# Patient Record
Sex: Female | Born: 1990 | Race: Black or African American | Hispanic: No | Marital: Single | Smoking: Never smoker
Health system: Southern US, Community
[De-identification: ages and names within clinical notes are randomized; demographics above are authoritative.]

## PROBLEM LIST (undated history)

## (undated) DIAGNOSIS — L732 Hidradenitis suppurativa: Secondary | ICD-10-CM

---

## 2016-11-18 ENCOUNTER — Ambulatory Visit: Payer: Self-pay

## 2017-02-22 ENCOUNTER — Emergency Department (HOSPITAL_COMMUNITY)
Admission: EM | Admit: 2017-02-22 | Discharge: 2017-02-23 | Disposition: A | Discharge status: Home / Self Care | Payer: Self-pay | Attending: Emergency Medicine | Admitting: Emergency Medicine

## 2017-02-22 ENCOUNTER — Encounter (HOSPITAL_COMMUNITY): Payer: Self-pay | Admitting: *Deleted

## 2017-02-22 ENCOUNTER — Emergency Department (HOSPITAL_COMMUNITY): Payer: Self-pay

## 2017-02-22 DIAGNOSIS — Z79899 Other long term (current) drug therapy: Secondary | ICD-10-CM | POA: Insufficient documentation

## 2017-02-22 DIAGNOSIS — J189 Pneumonia, unspecified organism: Secondary | ICD-10-CM | POA: Insufficient documentation

## 2017-02-22 DIAGNOSIS — J4541 Moderate persistent asthma with (acute) exacerbation: Secondary | ICD-10-CM | POA: Insufficient documentation

## 2017-02-22 MED ORDER — ALBUTEROL SULFATE (2.5 MG/3ML) 0.083% IN NEBU
5.0000 mg | INHALATION_SOLUTION | Freq: Once | RESPIRATORY_TRACT | Status: AC
  Administered 2017-02-23: 5 mg via RESPIRATORY_TRACT
  Filled 2017-02-22: qty 6

## 2017-02-22 MED ORDER — PREDNISONE 20 MG PO TABS
60.0000 mg | ORAL_TABLET | Freq: Once | ORAL | Status: AC
  Administered 2017-02-23: 60 mg via ORAL
  Filled 2017-02-22: qty 3

## 2017-02-22 NOTE — ED Triage Notes (Addendum)
Pt was recently diagnosed with pneumonia on 3/9 and placed on antibiotics. Pt went to urgent care today and was told they did not see any signs of pneumonia. Pt had elevated WBC and was told to go to ED for further evaluation.  Pt states her coughing and wheezing have gotten worse.

## 2017-02-22 NOTE — ED Notes (Signed)
Pt in xray

## 2017-02-22 NOTE — ED Notes (Signed)
Pt when to Urgent care a few hours ago. Had chest xray and blood work  Pt verbalized soar throat --  c/o of back, chest, pain SOB  Waiting provider ok to submit strep sample  Waiting provider ok to administer albuterol tx

## 2017-02-22 NOTE — ED Provider Notes (Signed)
[facility]-EMERGENCY DEPT Provider Note   CSN: [id number] Arrival date & time: 02/22/17  2114  By signing my name below, I, Doreatha Martin, attest that this documentation has been prepared under the direction and in the presence of Devoria Albe, MD. Electronically Signed: Doreatha Martin, ED Scribe. 02/22/17. 11:20 PM.  Time seen: 11:06 PM   History   Chief Complaint Chief Complaint  Patient presents with  . Cough  . Shortness of Breath    HPI Lauren Gregory is a 26 y.o. female on no regular daily medications who presents to the Emergency Department complaining of persistent productive cough with yellow/green phlegm for 16 days. Pt was dx with PNA on 02/09/17 at [facility] after 3 days of productive cough with green/yellow sputum, rhinorrhea with clear/yellow drainage, sore throat, diarrhea, chills, subjective fever, generalized body aches, b/l upper back pain, SOB, wheezing, chest tightness, and was placed on amoxicillin, which improved her symptoms temporarily until 5 days ago after returning to work on March 17. She was also seen on 02/18/17 at the same UC and was given Levaquin, of which she has 5 doses left and states has not improved her symptoms. After being seen at the same UC today, she had CXR with no findings of PNA, and was referred to the ED for elevated WBC count. Per pt, she continues to experience all of the symptoms she had initially. Pt states her chills are more prevalent at night and her sore throat is worsened with swallowing. She also notes occasional specks of blood in her phlegm, but denies gross blood or hemoptysis. Per pt, she has used an inhaler at home with temporary relief of wheezing. This is the first time in her adulthood using an inhaler. She states she had bronchitis as a child. She denies FHx of asthma. Pt is a current smoker, 5-6 cigarettes per day. She is an occasional drinker. 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 She denies sneezing,  trouble swallowing, additional complaints.   PCP- No PCP Per Patient    The history is provided by the patient. No language interpreter was used.    History reviewed. No pertinent past medical history.  There are no active problems to display for this patient.   History reviewed. No pertinent surgical history.  OB History    No data available       Home Medications    Prior to Admission medications   Medication Sig Start Date End Date Taking? Authorizing Provider  levofloxacin (LEVAQUIN) 500 MG tablet Take 500 mg by mouth daily.   Yes Historical Provider, MD  albuterol (PROVENTIL HFA;VENTOLIN HFA) 108 (90 Base) MCG/ACT inhaler Inhale 2 puffs into the lungs every 4 (four) hours as needed for wheezing or shortness of breath. 02/23/17   Devoria Albe, MD  amoxicillin (AMOXIL) 500 MG capsule Take 1 capsule (500 mg total) by mouth 3 (three) times daily. 02/23/17   Devoria Albe, MD  predniSONE (DELTASONE) 20 MG tablet Take 3 po QD x 3d , then 2 po QD x 3d then 1 po QD x 3d 02/23/17   Devoria Albe, MD    Family History No family history on file.  Social History Social History  Substance Use Topics  . Smoking status: Never Smoker  . Smokeless tobacco: Never Used  . Alcohol use Yes  employed   Allergies   Patient has no known allergies.   Review of Systems Review of Systems  Constitutional: Positive for chills  and fever (subjective).  HENT: Positive for rhinorrhea and sore throat. Negative for sneezing and trouble swallowing.   Respiratory: Positive for cough, chest tightness, shortness of breath and wheezing.   Gastrointestinal: Positive for diarrhea.  Musculoskeletal: Positive for back pain and myalgias (generalized).  All other systems reviewed and are negative.  Physical Exam Updated Vital Signs BP (!) 144/80 (BP Location: Right Arm)   Pulse (!) 109   Temp 99.6 F (37.6 C) (Oral)   Resp (!) 21   Wt 215 lb (97.5 kg)   SpO2 96%   Vital signs normal except for tachycardia  and low grade temp   Physical Exam  Constitutional: She is oriented to person, place, and time. She appears well-developed and well-nourished.  Non-toxic appearance. She does not appear ill. No distress.  HENT:  Head: Normocephalic and atraumatic.  Right Ear: External ear normal.  Left Ear: External ear normal.  Nose: Nose normal. No mucosal edema or rhinorrhea.  Mouth/Throat: Oropharynx is clear and moist and mucous membranes are normal. No dental abscesses or uvula swelling.  Eyes: Conjunctivae and EOM are normal. Pupils are equal, round, and reactive to light.  Neck: Normal range of motion and full passive range of motion without pain. Neck supple.  Cardiovascular: Normal rate, regular rhythm and normal heart sounds.  Exam reveals no gallop and no friction rub.   No murmur heard. Pulmonary/Chest: Effort normal. Tachypnea noted. No respiratory distress. She has decreased breath sounds. She has wheezes. She has no rhonchi. She has no rales. She exhibits no tenderness and no crepitus.  Rare wheeze.   Abdominal: Soft. Normal appearance and bowel sounds are normal. She exhibits no distension. There is no tenderness. There is no rebound and no guarding.  Musculoskeletal: Normal range of motion. She exhibits no edema or tenderness.  Moves all extremities well.   Neurological: She is alert and oriented to person, place, and time. She has normal strength. No cranial nerve deficit.  Skin: Skin is warm, dry and intact. No rash noted. No erythema. No pallor.  Psychiatric: She has a normal mood and affect. Her speech is normal and behavior is normal. Her mood appears not anxious.  Nursing note and vitals reviewed.  ED Treatments / Results   Labs (all labs ordered are listed, but only abnormal results are displayed) Results for orders placed or performed during the hospital encounter of 02/22/17  Rapid strep screen  Result Value Ref Range   Streptococcus, Group A Screen (Direct) NEGATIVE  NEGATIVE   Laboratory interpretation all normal     EKG  EKG Interpretation None       Radiology Dg Chest 2 View  Result Date: 02/22/2017 CLINICAL DATA:  Persistent productive cough EXAM: CHEST  2 VIEW COMPARISON:  None. FINDINGS: Streaky perihilar opacities. No focal consolidation or effusion. Normal heart size. No pneumothorax. IMPRESSION: Streaky perihilar opacities could relate to central airways infection/inflammation. There is no focal pneumonia. Electronically Signed   By: Jasmine PangKim  Fujinaga M.D.   On: 02/22/2017 23:52    Procedures Procedures (including critical care time)  Medications Ordered in ED Medications  aerochamber Z-Stat Plus/medium 1 each (not administered)  albuterol (PROVENTIL) (2.5 MG/3ML) 0.083% nebulizer solution 5 mg (5 mg Nebulization Given 02/23/17 0007)  predniSONE (DELTASONE) tablet 60 mg (60 mg Oral Given 02/23/17 0007)  albuterol (PROVENTIL) (2.5 MG/3ML) 0.083% nebulizer solution 5 mg (5 mg Nebulization Given 02/23/17 0153)  ipratropium (ATROVENT) nebulizer solution 0.5 mg (0.5 mg Nebulization Given 02/23/17 0153)  ibuprofen (ADVIL,MOTRIN) tablet  600 mg (600 mg Oral Given 02/23/17 0152)  ipratropium (ATROVENT) nebulizer solution 0.5 mg (0.5 mg Nebulization Given 02/23/17 0209)  albuterol (PROVENTIL) (2.5 MG/3ML) 0.083% nebulizer solution 5 mg (2.5 mg Nebulization Given 02/23/17 0210)  albuterol (PROVENTIL) (2.5 MG/3ML) 0.083% nebulizer solution 5 mg (2.5 mg Nebulization Given 02/23/17 0224)   Initial Impression / Assessment and Plan / ED Course  I have reviewed the triage vital signs and the nursing notes.  Pertinent labs & imaging results that were available during my care of the patient were reviewed by me and considered in my medical decision making (see chart for details).     DIAGNOSTIC STUDIES: Oxygen Saturation is 96% on RA, adequate by my interpretation.    COORDINATION OF CARE: 11:17 PM Discussed treatment plan with pt at bedside which includes  rapid strep, CXR, prednisone, breathing treatment and pt agreed to plan.  12:45 AM Recheck Improved air movement, rare wheeze. Will order additional breathing tx.    2:40 AM On recheck pt reports improvement after second breathing tx. Lungs improved with no appreciable wheeze. Pt requests ibuprofen for back pain. Pt reports she does not have a spacer at home, so she will be provided with one along with inhaler refill prior to discharge. Pt will also be discharged with steroids and amoxicillin. Recommended Mucinex DM for cough, alternating Tylenol/Motrin for chest wall pain. Strict return precautions discussed.   Final Clinical Impressions(s) / ED Diagnoses   Final diagnoses:  Moderate persistent asthma with exacerbation  Community acquired pneumonia, unspecified laterality    New Prescriptions New Prescriptions   ALBUTEROL (PROVENTIL HFA;VENTOLIN HFA) 108 (90 BASE) MCG/ACT INHALER    Inhale 2 puffs into the lungs every 4 (four) hours as needed for wheezing or shortness of breath.   AMOXICILLIN (AMOXIL) 500 MG CAPSULE    Take 1 capsule (500 mg total) by mouth 3 (three) times daily.   PREDNISONE (DELTASONE) 20 MG TABLET    Take 3 po QD x 3d , then 2 po QD x 3d then 1 po QD x 3d    Plan discharge  Devoria Albe, MD, FACEP  I personally performed the services described in this documentation, which was scribed in my presence. The recorded information has been reviewed and considered.  Devoria Albe, MD, Concha Pyo, MD 02/23/17 [phone]

## 2017-02-23 LAB — RAPID STREP SCREEN (MED CTR MEBANE ONLY): Streptococcus, Group A Screen (Direct): NEGATIVE

## 2017-02-23 MED ORDER — IPRATROPIUM BROMIDE 0.02 % IN SOLN
0.5000 mg | Freq: Once | RESPIRATORY_TRACT | Status: AC
  Administered 2017-02-23: 0.5 mg via RESPIRATORY_TRACT
  Filled 2017-02-23: qty 2.5

## 2017-02-23 MED ORDER — ALBUTEROL SULFATE (2.5 MG/3ML) 0.083% IN NEBU
5.0000 mg | INHALATION_SOLUTION | Freq: Once | RESPIRATORY_TRACT | Status: AC
  Administered 2017-02-23: 2.5 mg via RESPIRATORY_TRACT
  Filled 2017-02-23: qty 6

## 2017-02-23 MED ORDER — AEROCHAMBER Z-STAT PLUS/MEDIUM MISC
1.0000 | Freq: Once | Status: AC
  Administered 2017-02-23: 1
  Filled 2017-02-23: qty 1

## 2017-02-23 MED ORDER — ALBUTEROL SULFATE (2.5 MG/3ML) 0.083% IN NEBU
5.0000 mg | INHALATION_SOLUTION | Freq: Once | RESPIRATORY_TRACT | Status: AC
  Administered 2017-02-23: 2.5 mg via RESPIRATORY_TRACT

## 2017-02-23 MED ORDER — IBUPROFEN 200 MG PO TABS
600.0000 mg | ORAL_TABLET | Freq: Once | ORAL | Status: AC
  Administered 2017-02-23: 600 mg via ORAL
  Filled 2017-02-23: qty 3

## 2017-02-23 MED ORDER — ALBUTEROL SULFATE HFA 108 (90 BASE) MCG/ACT IN AERS: 2.0000 | INHALATION_SPRAY | RESPIRATORY_TRACT | 0 refills | Status: DC | PRN

## 2017-02-23 MED ORDER — AMOXICILLIN 500 MG PO CAPS: 500.0000 mg | ORAL_CAPSULE | Freq: Three times a day (TID) | ORAL | 0 refills | Status: DC

## 2017-02-23 MED ORDER — PREDNISONE 20 MG PO TABS: ORAL_TABLET | ORAL | 0 refills | Status: DC

## 2017-02-23 MED ORDER — ALBUTEROL SULFATE (2.5 MG/3ML) 0.083% IN NEBU
5.0000 mg | INHALATION_SOLUTION | Freq: Once | RESPIRATORY_TRACT | Status: AC
  Administered 2017-02-23: 5 mg via RESPIRATORY_TRACT
  Filled 2017-02-23: qty 6

## 2017-02-23 NOTE — ED Notes (Signed)
Pt ambulated around the department and back to the pt room 99%-98% a little short of breath

## 2017-02-23 NOTE — Discharge Instructions (Signed)
Use your inhaler with the spacer to make it more effective. Add the Amoxil to your Levaquin antibiotic. Take the prednisone until gone. Take mucinex DM OTC for cough (get the generic).  Recheck if you get a high fever, you have uncontrolled vomiting or you seem to be getting worse instead of better.

## 2017-02-23 NOTE — ED Notes (Signed)
Provider notified pt spilled meds and meds re ordered and given per order request

## 2017-02-25 LAB — CULTURE, GROUP A STREP (THRC)

## 2018-10-21 IMAGING — CR DG CHEST 2V
2 series · 2 of 2 positions shown · non-contrast
Comparison: None.

CLINICAL DATA: Persistent productive cough

EXAM:
CHEST  2 VIEW

[w chest pa]
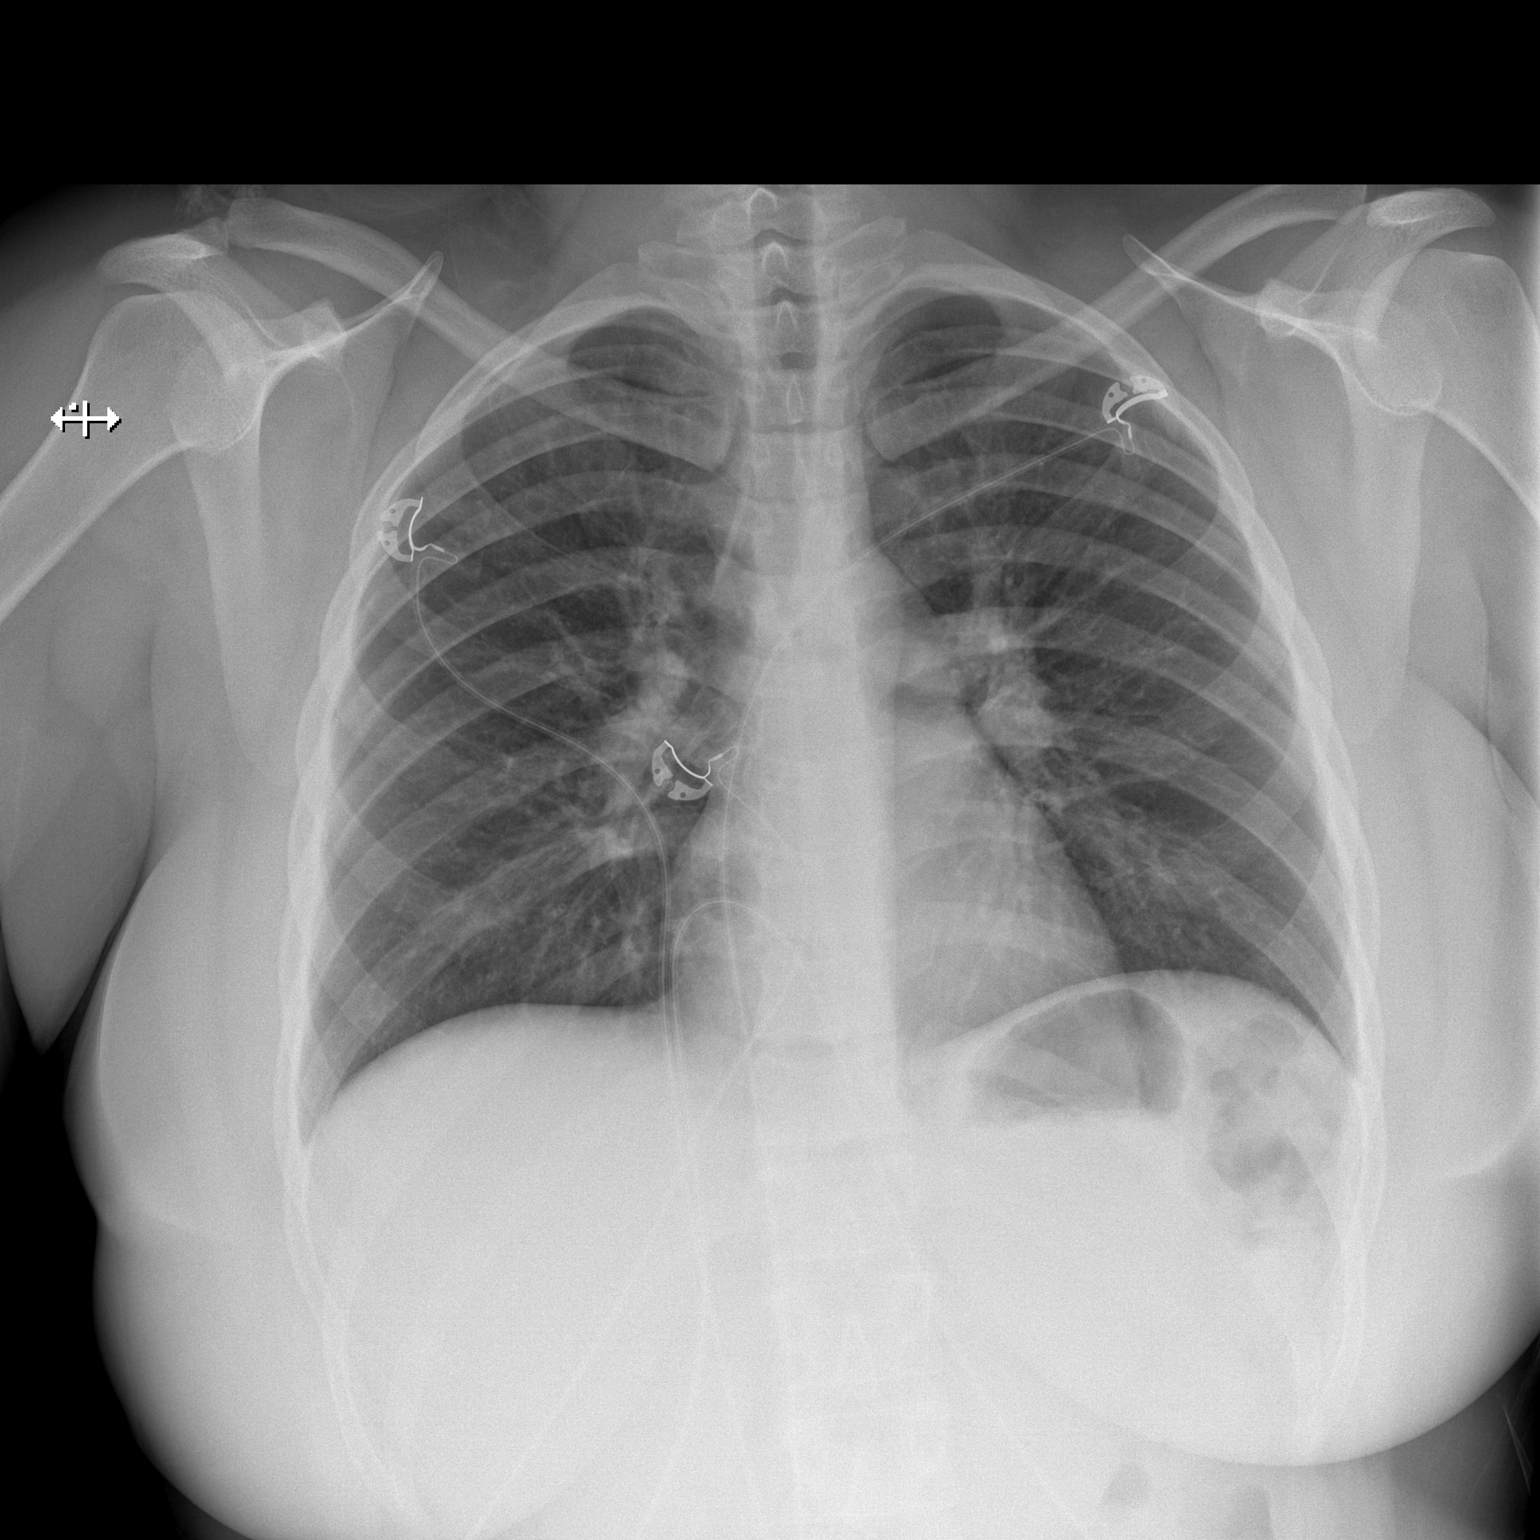

[w chest lat]
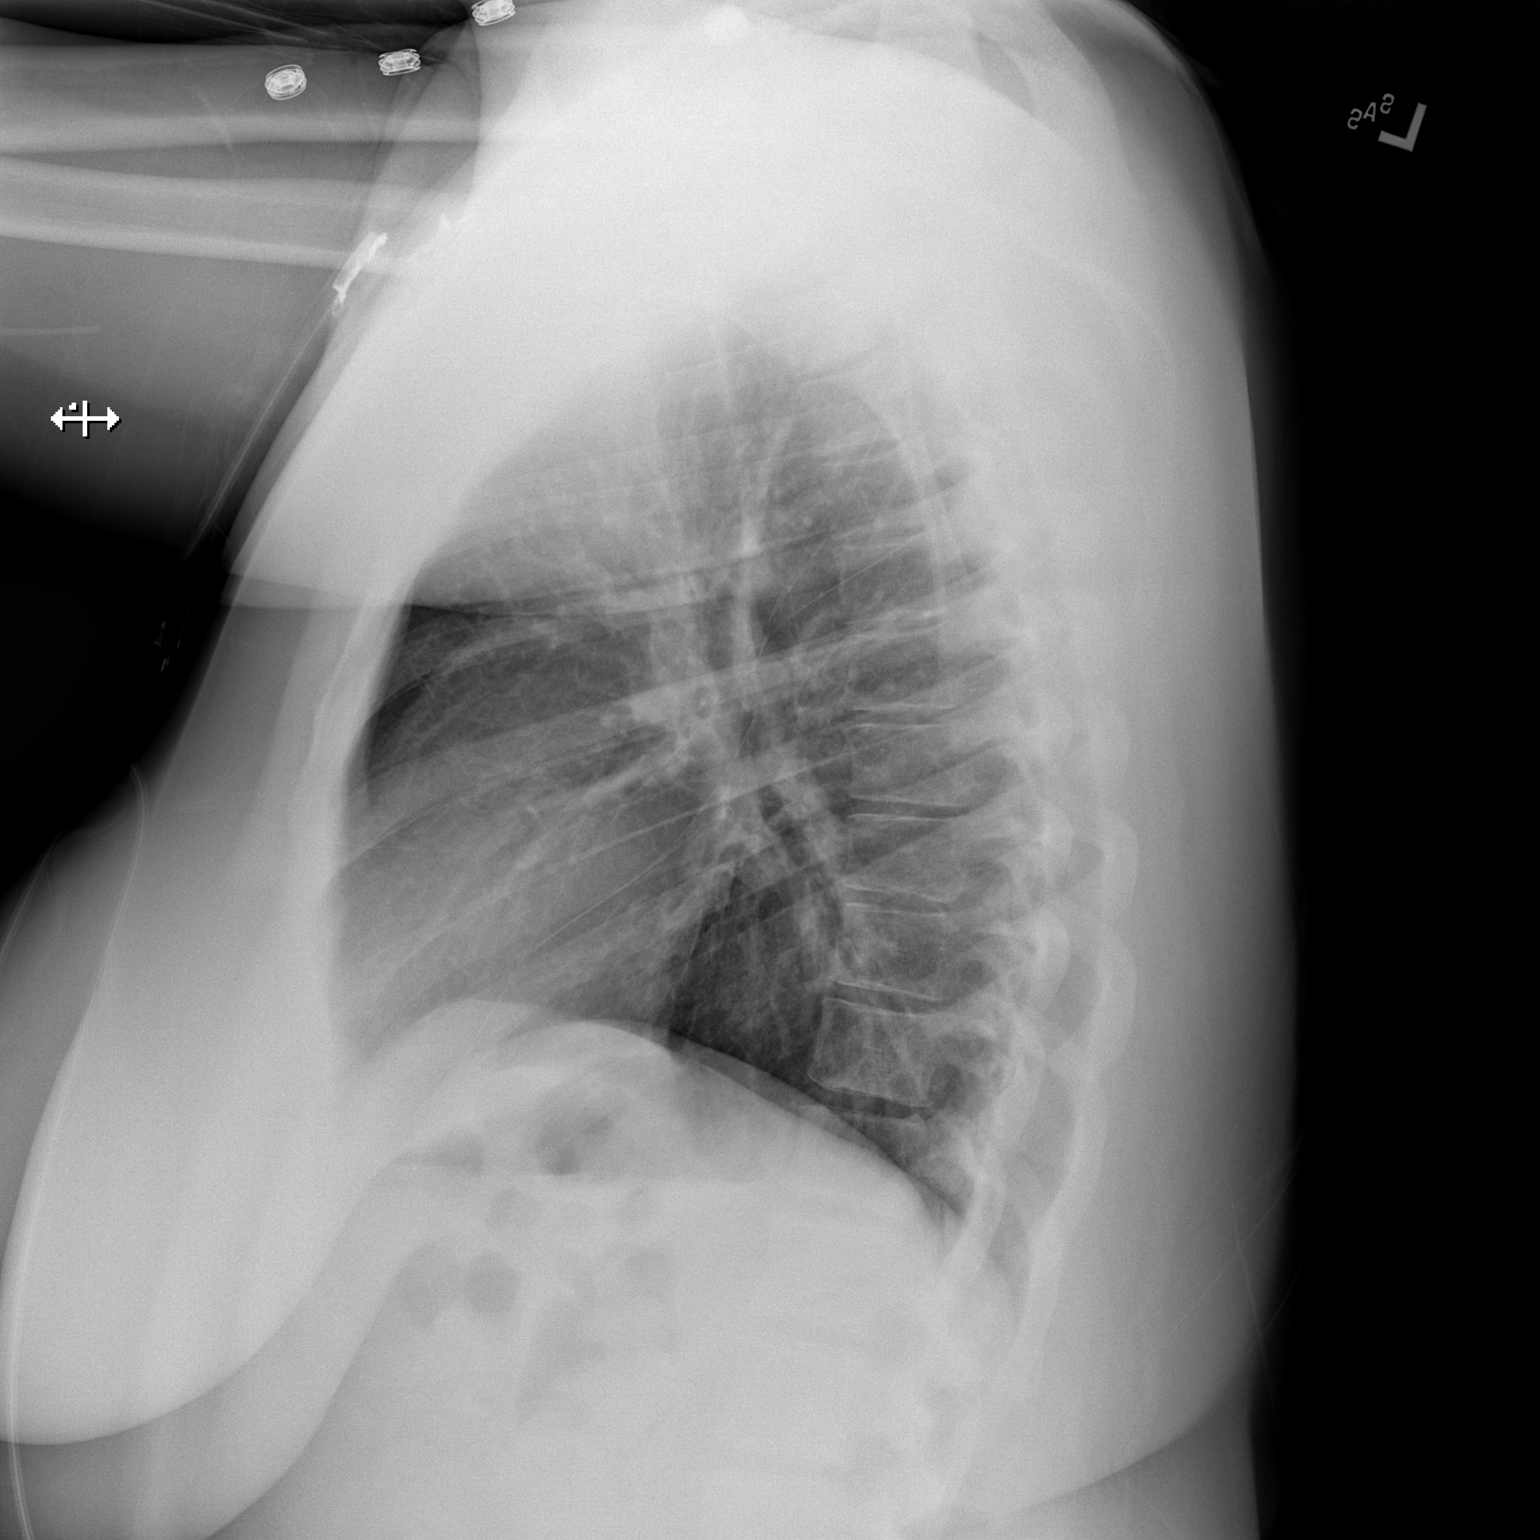

[2 of 2 positions shown; findings below may reference images not displayed]

FINDINGS: Streaky perihilar opacities. No focal consolidation or effusion.
Normal heart size. No pneumothorax.
IMPRESSION: Streaky perihilar opacities could relate to central airways
infection/inflammation. There is no focal pneumonia.

## 2020-05-18 ENCOUNTER — Ambulatory Visit
Admission: EM | Admit: 2020-05-18 | Discharge: 2020-05-18 | Disposition: A | Discharge status: Home / Self Care | Payer: Self-pay | Attending: Emergency Medicine | Admitting: Emergency Medicine

## 2020-05-18 DIAGNOSIS — Z7251 High risk heterosexual behavior: Secondary | ICD-10-CM

## 2020-05-18 DIAGNOSIS — N898 Other specified noninflammatory disorders of vagina: Secondary | ICD-10-CM

## 2020-05-18 LAB — POCT URINALYSIS DIP (MANUAL ENTRY)
Bilirubin, UA: NEGATIVE
Blood, UA: NEGATIVE
Glucose, UA: NEGATIVE mg/dL
Leukocytes, UA: NEGATIVE
Nitrite, UA: NEGATIVE
Protein Ur, POC: NEGATIVE mg/dL
Spec Grav, UA: >=1.03 — AB (ref 1.010–1.025)
Urobilinogen, UA: 0.2 E.U./dL
pH, UA: 5.5 (ref 5.0–8.0)

## 2020-05-18 LAB — POCT URINE PREGNANCY: Preg Test, Ur: NEGATIVE

## 2020-05-18 NOTE — ED Provider Notes (Signed)
[facility]    CSN: [id number] Arrival date & time: 05/18/20  1713      History   Chief Complaint Chief Complaint  Patient presents with  . SEXUALLY TRANSMITTED DISEASE    HPI Loye Reininger is a 29 y.o. female resenting for 2-day course of vaginal irritation.  Patient states he had breath, unprotected intercourse yesterday and has noticed mild burning with urination, vaginal swelling, spotting.  Patient requesting STD testing.  Denies known exposure.  No pelvic or abdominal pain, nausea or vomiting, fever.  History reviewed. No pertinent past medical history.  There are no problems to display for this patient.   History reviewed. No pertinent surgical history.  OB History   No obstetric history on file.      Home Medications    Prior to Admission medications   Not on File    Family History History reviewed. No pertinent family history.  Social History Social History   Tobacco Use  . Smoking status: Never Smoker  . Smokeless tobacco: Never Used  Substance Use Topics  . Alcohol use: Yes  . Drug use: Not Currently     Allergies   Patient has no known allergies.   Review of Systems As per HPI   Physical Exam Triage Vital Signs ED Triage Vitals  Enc Vitals Group     BP      Pulse      Resp      Temp      Temp src      SpO2      Weight      Height      Head Circumference      Peak Flow      Pain Score      Pain Loc      Pain Edu?      Excl. in GC?    No data found.  Updated Vital Signs BP 127/84 (BP Location: Left Arm)   Pulse 84   Temp 98.6 F (37 C) (Oral)   Resp 18   SpO2 96%   Visual Acuity Right Eye Distance:   Left Eye Distance:   Bilateral Distance:    Right Eye Near:   Left Eye Near:    Bilateral Near:     Physical Exam Constitutional:      General: She is not in acute distress. HENT:     Head: Normocephalic and atraumatic.  Eyes:     General: No scleral icterus.    Pupils: Pupils are equal,  round, and reactive to light.  Cardiovascular:     Rate and Rhythm: Normal rate.  Pulmonary:     Effort: Pulmonary effort is normal.  Abdominal:     General: Bowel sounds are normal.     Palpations: Abdomen is soft.     Tenderness: There is no abdominal tenderness. There is no right CVA tenderness, left CVA tenderness or guarding.  Genitourinary:    Comments: Patient declined, self-swab performed Skin:    Coloration: Skin is not jaundiced or pale.  Neurological:     Mental Status: She is alert and oriented to person, place, and time.      UC Treatments / Results  Labs (all labs ordered are listed, but only abnormal results are displayed) Labs Reviewed  POCT URINALYSIS DIP (MANUAL ENTRY) - Abnormal; Notable for the following components:      Result Value   Ketones, POC UA trace (5) (*)    Spec Grav, UA >=1.030 (*)  All other components within normal limits  POCT URINE PREGNANCY  CERVICOVAGINAL ANCILLARY ONLY    EKG   Radiology No results found.  Procedures Procedures (including critical care time)  Medications Ordered in UC Medications - No data to display  Initial Impression / Assessment and Plan / UC Course  I have reviewed the triage vital signs and the nursing notes.  Pertinent labs & imaging results that were available during my care of the patient were reviewed by me and considered in my medical decision making (see chart for details).     Urine pregnancy negative, urine dipstick with elevated specific gravity and trace ketones.  Culture deferred.  Cytology pending: We will treat if indicated.  Reviewed vaginal hygiene and supportive measures for symptoms.  Return precautions discussed, patient verbalized understanding and is agreeable to plan. Final Clinical Impressions(s) / UC Diagnoses   Final diagnoses:  Unprotected sex  Vaginal irritation     Discharge Instructions     Testing for chlamydia, gonorrhea, trichomonas is pending: please look for  these results on the MyChart app/website.  We will notify you if you are positive and outline treatment at that time.  Important to avoid all forms of sexual intercourse (oral, vaginal, anal) with any/all partners for the next 7 days to avoid spreading/reinfecting. Any/all sexual partners should be notified of testing/treatment today.  Return for persistent/worsening symptoms or if you develop fever, abdominal or pelvic pain, blood in your urine, or are re-exposed to an STI.    ED Prescriptions    None     PDMP not reviewed this encounter.   Hall-Potvin, [place], [place] 05/18/20 1821

## 2020-05-18 NOTE — Discharge Instructions (Signed)

## 2020-05-18 NOTE — ED Triage Notes (Signed)
Pt states had rough intercourse yesterday and now having burning on urination, vaginal area feels swollen, and some spotting. Pt requesting STD testing.

## 2020-05-21 LAB — CERVICOVAGINAL ANCILLARY ONLY
Bacterial Vaginitis (gardnerella): NEGATIVE
Candida Glabrata: NEGATIVE
Candida Vaginitis: NEGATIVE
Chlamydia: NEGATIVE
Comment: NEGATIVE
Comment: NEGATIVE
Comment: NEGATIVE
Comment: NEGATIVE
Comment: NEGATIVE
Comment: NORMAL
Neisseria Gonorrhea: NEGATIVE
Trichomonas: NEGATIVE

## 2020-12-06 ENCOUNTER — Encounter: Payer: Self-pay | Admitting: Emergency Medicine

## 2020-12-06 ENCOUNTER — Ambulatory Visit
Admission: EM | Admit: 2020-12-06 | Discharge: 2020-12-06 | Disposition: A | Discharge status: Home / Self Care | Payer: Self-pay | Attending: Emergency Medicine | Admitting: Emergency Medicine

## 2020-12-06 ENCOUNTER — Other Ambulatory Visit: Payer: Self-pay

## 2020-12-06 DIAGNOSIS — Z202 Contact with and (suspected) exposure to infections with a predominantly sexual mode of transmission: Secondary | ICD-10-CM | POA: Insufficient documentation

## 2020-12-06 DIAGNOSIS — Z113 Encounter for screening for infections with a predominantly sexual mode of transmission: Secondary | ICD-10-CM | POA: Insufficient documentation

## 2020-12-06 MED ORDER — DOXYCYCLINE HYCLATE 100 MG PO CAPS: 100.0000 mg | ORAL_CAPSULE | Freq: Two times a day (BID) | ORAL | 0 refills | Status: AC

## 2020-12-06 NOTE — ED Triage Notes (Signed)
Pt states positive exposure to chlamydia yesterday. Denies sx's.

## 2020-12-06 NOTE — Discharge Instructions (Addendum)
Begin doxycycline twice daily for 1 week  We are testing you for Gonorrhea, Chlamydia, Trichomonas, Yeast and Bacterial Vaginosis. We will call you if anything is positive and let you know if you require any further treatment. Please inform partners of any positive results.   Please return if symptoms not improving with treatment, development of fever, nausea, vomiting, abdominal pain.

## 2020-12-06 NOTE — ED Provider Notes (Signed)
[facility]    CSN: [id number] Arrival date & time: 12/06/20  1921      History   Chief Complaint Chief Complaint  Patient presents with   Exposure to STD    HPI Lauren Gregory is a 30 y.o. female presenting today for evaluation of STD exposure.  Reports close partner recently tested positive for chlamydia.  She denies any symptoms of abdominal pain vaginal discharge or abnormal bleeding.  Denies urinary symptoms.  Denies any fevers nausea or vomiting.  HPI  History reviewed. No pertinent past medical history.  There are no problems to display for this patient.   History reviewed. No pertinent surgical history.  OB History   No obstetric history on file.      Home Medications    Prior to Admission medications   Medication Sig Start Date End Date Taking? Authorizing Provider  doxycycline (VIBRAMYCIN) 100 MG capsule Take 1 capsule (100 mg total) by mouth 2 (two) times daily for 7 days. 12/06/20 12/13/20 Yes Zyionna Pesce, Junius Creamer, PA-C    Family History History reviewed. No pertinent family history.  Social History Social History   Tobacco Use   Smoking status: Never Smoker   Smokeless tobacco: Never Used  Substance Use Topics   Alcohol use: Yes   Drug use: Not Currently     Allergies   Patient has no known allergies.   Review of Systems Review of Systems  Constitutional: Negative for fever.  Respiratory: Negative for shortness of breath.   Cardiovascular: Negative for chest pain.  Gastrointestinal: Negative for abdominal pain, diarrhea, nausea and vomiting.  Genitourinary: Negative for dysuria, flank pain, frequency, genital sores, hematuria, menstrual problem, vaginal bleeding, vaginal discharge and vaginal pain.  Musculoskeletal: Negative for back pain.  Skin: Negative for rash.  Neurological: Negative for dizziness, light-headedness and headaches.     Physical Exam Triage Vital Signs ED Triage Vitals  Enc Vitals Group     BP       Pulse      Resp      Temp      Temp src      SpO2      Weight      Height      Head Circumference      Peak Flow      Pain Score      Pain Loc      Pain Edu?      Excl. in GC?    No data found.  Updated Vital Signs BP 126/83 (BP Location: Left Arm)    Pulse 98    Temp 98.2 F (36.8 C) (Oral)    Resp 18    LMP 11/15/2020    SpO2 95%   Visual Acuity Right Eye Distance:   Left Eye Distance:   Bilateral Distance:    Right Eye Near:   Left Eye Near:    Bilateral Near:     Physical Exam Vitals and nursing note reviewed.  Constitutional:      Appearance: She is well-developed and well-nourished.     Comments: No acute distress  HENT:     Head: Normocephalic and atraumatic.     Nose: Nose normal.  Eyes:     Conjunctiva/sclera: Conjunctivae normal.  Cardiovascular:     Rate and Rhythm: Normal rate.  Pulmonary:     Effort: Pulmonary effort is normal. No respiratory distress.  Abdominal:     General: There is no distension.  Musculoskeletal:  General: Normal range of motion.     Cervical back: Neck supple.  Skin:    General: Skin is warm and dry.  Neurological:     Mental Status: She is alert and oriented to person, place, and time.  Psychiatric:        Mood and Affect: Mood and affect normal.      UC Treatments / Results  Labs (all labs ordered are listed, but only abnormal results are displayed) Labs Reviewed  CERVICOVAGINAL ANCILLARY ONLY    EKG   Radiology No results found.  Procedures Procedures (including critical care time)  Medications Ordered in UC Medications - No data to display  Initial Impression / Assessment and Plan / UC Course  I have reviewed the triage vital signs and the nursing notes.  Pertinent labs & imaging results that were available during my care of the patient were reviewed by me and considered in my medical decision making (see chart for details).     Vaginal swab pending to screen for gonorrhea chlamydia  trichomonas, empirically treating for chlamydia today given exposure.  Patient feels she has high risk for contracting this.  Will provide doxycycline x1 week.  Will alter therapy as needed based off results.  Discussed strict return precautions. Patient verbalized understanding and is agreeable with plan.  Final Clinical Impressions(s) / UC Diagnoses   Final diagnoses:  Exposure to chlamydia  Screen for STD (sexually transmitted disease)     Discharge Instructions     Begin doxycycline twice daily for 1 week  We are testing you for Gonorrhea, Chlamydia, Trichomonas, Yeast and Bacterial Vaginosis. We will call you if anything is positive and let you know if you require any further treatment. Please inform partners of any positive results.   Please return if symptoms not improving with treatment, development of fever, nausea, vomiting, abdominal pain.      ED Prescriptions    Medication Sig Dispense Auth. Provider   doxycycline (VIBRAMYCIN) 100 MG capsule Take 1 capsule (100 mg total) by mouth 2 (two) times daily for 7 days. 14 capsule Daved Mcfann, [place] C, PA-C     PDMP not reviewed this encounter.   Jomayra Novitsky, [place] C, PA-C 12/08/20 1027

## 2020-12-08 LAB — CERVICOVAGINAL ANCILLARY ONLY
Bacterial Vaginitis (gardnerella): POSITIVE — AB
Candida Glabrata: NEGATIVE
Candida Vaginitis: NEGATIVE
Chlamydia: NEGATIVE
Comment: NEGATIVE
Comment: NEGATIVE
Comment: NEGATIVE
Comment: NEGATIVE
Comment: NEGATIVE
Comment: NORMAL
Neisseria Gonorrhea: NEGATIVE
Trichomonas: NEGATIVE

## 2020-12-10 ENCOUNTER — Telehealth (HOSPITAL_COMMUNITY): Payer: Self-pay

## 2020-12-10 MED ORDER — METRONIDAZOLE 500 MG PO TABS: 500.0000 mg | ORAL_TABLET | Freq: Two times a day (BID) | ORAL | 0 refills | Status: AC

## 2022-08-09 ENCOUNTER — Ambulatory Visit
Admission: EM | Admit: 2022-08-09 | Discharge: 2022-08-09 | Disposition: A | Discharge status: Home / Self Care | Payer: No Typology Code available for payment source | Attending: Urgent Care | Admitting: Urgent Care

## 2022-08-09 DIAGNOSIS — L03211 Cellulitis of face: Secondary | ICD-10-CM | POA: Diagnosis not present

## 2022-08-09 DIAGNOSIS — L732 Hidradenitis suppurativa: Secondary | ICD-10-CM | POA: Diagnosis not present

## 2022-08-09 HISTORY — DX: Hidradenitis suppurativa: L73.2

## 2022-08-09 MED ORDER — NAPROXEN 500 MG PO TABS: 500.0000 mg | ORAL_TABLET | Freq: Two times a day (BID) | ORAL | 0 refills | Status: AC

## 2022-08-09 MED ORDER — DOXYCYCLINE HYCLATE 100 MG PO CAPS: 100.0000 mg | ORAL_CAPSULE | Freq: Two times a day (BID) | ORAL | 0 refills | Status: DC

## 2022-08-09 NOTE — ED Provider Notes (Signed)
  [facility]  Note:  This document was prepared using Conservation officer, historic buildings and may include unintentional dictation errors.  MRN: [id number] DOB: 1991-10-17  Subjective:   Lauren Gregory is a 31 y.o. female presenting for 1 month history of persistent and worsening pain, redness about the left side of her cheek extending to over the nose.  Patient has a history significant for hidradenitis suppurativa.  She was seen by her dermatologist recently and had steroids injected into the area on her face.  She has worsened since then.  Has not set up a follow-up appointment with them.  No current facility-administered medications for this encounter.  Current Outpatient Medications:    metroNIDAZOLE (FLAGYL) 500 MG tablet, Take 1 tablet (500 mg total) by mouth 2 (two) times daily., Disp: 14 tablet, Rfl: 0   No Known Allergies  Past Medical History:  Diagnosis Date   Hidradenitis suppurativa      History reviewed. No pertinent surgical history.  History reviewed. No pertinent family history.  Social History   Tobacco Use   Smoking status: Never   Smokeless tobacco: Never  Substance Use Topics   Alcohol use: Yes   Drug use: Not Currently    ROS   Objective:   Vitals: BP 117/82 (BP Location: Right Arm)   Pulse 80   Temp 99.2 F (37.3 C) (Oral)   Resp 20   SpO2 97%   Physical Exam Constitutional:      General: She is not in acute distress.    Appearance: Normal appearance. She is well-developed. She is not ill-appearing, toxic-appearing or diaphoretic.  HENT:     Head: Normocephalic and atraumatic.      Nose: Nose normal.     Mouth/Throat:     Mouth: Mucous membranes are moist.  Eyes:     General: No scleral icterus.       Right eye: No discharge.        Left eye: No discharge.     Extraocular Movements: Extraocular movements intact.  Cardiovascular:     Rate and Rhythm: Normal rate.  Pulmonary:     Effort: Pulmonary effort is  normal.  Skin:    General: Skin is warm and dry.  Neurological:     General: No focal deficit present.     Mental Status: She is alert and oriented to person, place, and time.  Psychiatric:        Mood and Affect: Mood normal.        Behavior: Behavior normal.     Assessment and Plan :   PDMP not reviewed this encounter.  1. Facial cellulitis   2. Hidradenitis suppurativa    I discussed with patient that without any obvious fluctuance I should not perform an incision and drainage over this area on her face as it could cause significant scar tissue.  This would be very problematic with her history of hidradenitis suppurativa.  Advised that we use an oral antibiotic and doxycycline, naproxen for pain and inflammation and warm compresses.  Emphasized need for follow-up with her dermatologist for further management. Counseled patient on potential for adverse effects with medications prescribed/recommended today, ER and return-to-clinic precautions discussed, patient verbalized understanding.    Wallis Bamberg, [place] 08/10/22 [phone]

## 2022-08-09 NOTE — ED Triage Notes (Signed)
Recently diagnose with  Hidradenitis suppurativa, has a spot on her left cheek by her nose, Has been there for a month, received a shot of steroids and it helped some but it bigger now. New insurance and can't see Derm till February.

## 2022-08-09 NOTE — ED Notes (Signed)
Recently diagnose with  Hidradenitis suppurativa, has a spot on her left check by her nose, Has been there for a month, received a shot of steroids and it helped some but it bigger now. New insurance and can't see Derm till February.

## 2022-08-11 ENCOUNTER — Ambulatory Visit
Admission: EM | Admit: 2022-08-11 | Discharge: 2022-08-11 | Disposition: A | Discharge status: Home / Self Care | Payer: No Typology Code available for payment source

## 2022-08-11 DIAGNOSIS — L0201 Cutaneous abscess of face: Secondary | ICD-10-CM

## 2022-08-11 NOTE — ED Provider Notes (Signed)
Patient returns to urgent care today for evaluation of worsening abscess to face-- She has been unable to get in to see dermatology. I recommended further evaluation in the ED as she may need imaging at this point and if drainage was necessary would benefit from plastics consult given location of abscess. Patient is frustrated with recommendation, is concerned she will have to wait "2 days" in the ED. She requests ultrasound at [facility] and discussed we do not have capabilities to do this in this office. She requested note for work which I did provide.    Tomi Bamberger, PA-C 08/11/22 1651

## 2022-08-11 NOTE — ED Triage Notes (Signed)
Pt following up for abscess on left side of face; pt was seen 2 days ago but is in more pain, area is spreading & she has been unable to be seen by dermatology.

## 2023-06-27 ENCOUNTER — Ambulatory Visit
Admission: EM | Admit: 2023-06-27 | Discharge: 2023-06-27 | Disposition: A | Discharge status: Home / Self Care | Payer: Managed Care, Other (non HMO)

## 2023-06-27 ENCOUNTER — Telehealth: Payer: Self-pay | Admitting: *Deleted

## 2023-06-27 ENCOUNTER — Encounter: Payer: Self-pay | Admitting: *Deleted

## 2023-06-27 ENCOUNTER — Other Ambulatory Visit: Payer: Self-pay

## 2023-06-27 DIAGNOSIS — J029 Acute pharyngitis, unspecified: Secondary | ICD-10-CM

## 2023-06-27 MED ORDER — AMOXICILLIN 500 MG PO CAPS: 500.0000 mg | ORAL_CAPSULE | Freq: Three times a day (TID) | ORAL | 0 refills | Status: DC

## 2023-06-27 MED ORDER — IBUPROFEN 800 MG PO TABS: 800.0000 mg | ORAL_TABLET | Freq: Three times a day (TID) | ORAL | 0 refills | Status: DC

## 2023-06-27 MED ORDER — IBUPROFEN 800 MG PO TABS
800.0000 mg | ORAL_TABLET | Freq: Once | ORAL | Status: AC
  Administered 2023-06-27: 800 mg via ORAL

## 2023-06-27 MED ORDER — IBUPROFEN 800 MG PO TABS: 800.0000 mg | ORAL_TABLET | Freq: Three times a day (TID) | ORAL | 0 refills | Status: AC

## 2023-06-27 MED ORDER — AMOXICILLIN 500 MG PO CAPS: 500.0000 mg | ORAL_CAPSULE | Freq: Three times a day (TID) | ORAL | 0 refills | Status: AC

## 2023-06-27 NOTE — Discharge Instructions (Signed)
Follow up if no gradual improvement or with any worsening symptoms.

## 2023-06-27 NOTE — ED Provider Notes (Signed)
[facility]    CSN: [id number] Arrival date & time: 06/27/23  1152      History   Chief Complaint Chief Complaint  Patient presents with   Sore Throat    HPI Lauren Gregory is a 32 y.o. female.   Patient here today for evaluation of sore throat, painful swallowing and subjective fever that started last night.  She states that she does feel as if her throat is swollen somewhat.  She denies any cough or congestion.  She has not had any vomiting or diarrhea.  She has not taken any medication for symptoms.  The history is provided by the patient.    Past Medical History:  Diagnosis Date   Hidradenitis suppurativa     There are no problems to display for this patient.   History reviewed. No pertinent surgical history.  OB History   No obstetric history on file.      Home Medications    Prior to Admission medications   Medication Sig Start Date End Date Taking? Authorizing Provider  metroNIDAZOLE (FLAGYL) 500 MG tablet Take 1 tablet (500 mg total) by mouth 2 (two) times daily. 12/10/20   Merrilee Jansky, MD  phentermine (ADIPEX-P) 37.5 MG tablet Take 1 tablet by mouth daily.   Yes [provider]  topiramate (TOPAMAX) 25 MG tablet Take 25 mg by mouth daily. 06/06/23  Yes [provider]  amoxicillin (AMOXIL) 500 MG capsule Take 1 capsule (500 mg total) by mouth 3 (three) times daily for 10 days. 06/27/23 07/07/23  Merrilee Jansky, MD  ibuprofen (ADVIL) 800 MG tablet Take 1 tablet (800 mg total) by mouth 3 (three) times daily. 06/27/23   Merrilee Jansky, MD  naproxen (NAPROSYN) 500 MG tablet Take 1 tablet (500 mg total) by mouth 2 (two) times daily with a meal. 08/09/22   Wallis Bamberg, PA-C    Family History Family History  Family history unknown: Yes    Social History Social History   Tobacco Use   Smoking status: Never   Smokeless tobacco: Never  Vaping Use   Vaping status: Every Day  Substance Use Topics   Alcohol use: Yes     Comment: occasionally   Drug use: Not Currently     Allergies   Patient has no known allergies.   Review of Systems Review of Systems  Constitutional:  Positive for fever.  HENT:  Positive for sore throat. Negative for congestion and ear pain.   Eyes:  Negative for discharge and redness.  Respiratory:  Negative for cough, shortness of breath and wheezing.   Gastrointestinal:  Negative for abdominal pain, diarrhea, nausea and vomiting.     Physical Exam Triage Vital Signs ED Triage Vitals  Encounter Vitals Group     BP      Systolic BP Percentile      Diastolic BP Percentile      Pulse      Resp      Temp      Temp src      SpO2      Weight      Height      Head Circumference      Peak Flow      Pain Score      Pain Loc      Pain Education      Exclude from Growth Chart    No data found.  Updated Vital Signs BP (!) 145/84   Pulse (!) 122  Temp 98.9 F (37.2 C) (Oral)   Resp 18   LMP 05/29/2023   SpO2 98%      Physical Exam Vitals and nursing note reviewed.  Constitutional:      General: She is not in acute distress.    Appearance: Normal appearance. She is not ill-appearing.  HENT:     Head: Normocephalic and atraumatic.     Right Ear: Tympanic membrane normal.     Left Ear: Tympanic membrane normal.     Nose: Congestion present.     Mouth/Throat:     Mouth: Mucous membranes are moist.     Pharynx: Posterior oropharyngeal erythema present. No oropharyngeal exudate.  Eyes:     Conjunctiva/sclera: Conjunctivae normal.  Cardiovascular:     Rate and Rhythm: Normal rate and regular rhythm.     Heart sounds: Normal heart sounds. No murmur heard. Pulmonary:     Effort: Pulmonary effort is normal. No respiratory distress.     Breath sounds: Normal breath sounds. No wheezing, rhonchi or rales.  Skin:    General: Skin is warm and dry.  Neurological:     Mental Status: She is alert.  Psychiatric:        Mood and Affect: Mood normal.        Thought  Content: Thought content normal.      UC Treatments / Results  Labs (all labs ordered are listed, but only abnormal results are displayed) Labs Reviewed  POCT RAPID STREP A (OFFICE)  POCT URINE PREGNANCY    EKG   Radiology No results found.  Procedures Procedures (including critical care time)  Medications Ordered in UC Medications  ibuprofen (ADVIL) tablet 800 mg (800 mg Oral Given 06/27/23 1220)    Initial Impression / Assessment and Plan / UC Course  I have reviewed the triage vital signs and the nursing notes.  Pertinent labs & imaging results that were available during my care of the patient were reviewed by me and considered in my medical decision making (see chart for details).    Will treat to cover strep despite negative strep test given appearance of throat and possible fever/tachycardia.  Amoxicillin prescribed as well as ibuprofen for pain.  Patient specifically requested 800 mg ibuprofen prescription.  Discussed that she would need to take medication with food to prevent GI complications.  Encouraged follow-up if no gradual improvement or with any worsening or further concerns.  Final Clinical Impressions(s) / UC Diagnoses   Final diagnoses:  Acute pharyngitis, unspecified etiology     Discharge Instructions      Follow up if no gradual improvement or with any worsening symptoms.      ED Prescriptions     Medication Sig Dispense Auth. Provider   ibuprofen (ADVIL) 800 MG tablet Take 1 tablet (800 mg total) by mouth 3 (three) times daily. 21 tablet Erma Pinto F, PA-C   amoxicillin (AMOXIL) 500 MG capsule Take 1 capsule (500 mg total) by mouth 3 (three) times daily for 10 days. 30 capsule Tomi Bamberger, PA-C      PDMP not reviewed this encounter.   Tomi Bamberger, PA-C 06/27/23 1229

## 2023-06-27 NOTE — ED Triage Notes (Signed)
C/O sore throat, throat swelling, painful swallowing, tactile fever onset last night. Has not taken any meds to help alleviate sxs.

## 2025-01-02 ENCOUNTER — Ambulatory Visit (INDEPENDENT_AMBULATORY_CARE_PROVIDER_SITE_OTHER): Admitting: Emergency Medicine

## 2025-01-02 VITALS — BP 134/91 | HR 81 | Ht 79.0 in | Wt 215.0 lb

## 2025-01-02 DIAGNOSIS — F4323 Adjustment disorder with mixed anxiety and depressed mood: Secondary | ICD-10-CM

## 2025-01-02 MED ORDER — BUPROPION HCL ER (XL) 150 MG PO TB24: 150.0000 mg | ORAL_TABLET | Freq: Every day | ORAL | 1 refills | Status: AC

## 2025-01-02 NOTE — Progress Notes (Signed)
 [facility] MD/PA/NP Initial Note  01/02/2025 3:44 PM Lauren Gregory  MRN:  [id number]  Chief Complaint:  Chief Complaint   Follow-up; Depression; Anxiety     HPI:  Mrs. Lauren Gregory is a 34 yo F presenting to clinic for new patient psychiatric evaluation due to a gradual worsening of depression anxiety since turning 33 a few months ago. 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  Current Psych Medication Regimen: None  Pt reports episodes of sadness, tearfulness, and hopelessness occurring at random times throughout the day. Energy and motivation are low, and she reports anhedonia, noting she no longer enjoys previously enjoyable activities such as walking and spending time with family and friends. She endorses poor concentration, decision-making, and memory, and reports difficulty focusing on tasks due to lack of motivation to complete them. Her biggest concern is low energy and motivation, and she expresses interest in starting medication to specifically help with these symptoms.  Anxiety symptoms are ok controlled with some worry and restlessness; no panic attacks reported. Sleep is ok. Pt reports going to bed at 12pm/1am and wakes up at 730am. Denies frequent awakenings, but acknowledges needing to improve sleep quality with reducing screen time and going to bed earlier. Appetite is stable,  with phentermine. Pt is being intentional about implementing healthy lifestyle modifications. Reports weight gain. Intact ADLs and personal hygiene. Ongoing symptom monitoring continues.   Pt reports having expectations at this stage of life at 33 for more confidence, greater financial stability, and a bigger house, and expresses dissatisfaction with her current environment, which she feels  does not align with these expectations. She reports her fiance's mental health condition is taking a toll on her.    Not engaged in therapy currently, but actively looking for therapist.  Denies mania, delirium, AVH, SI, HI, or self-harm behaviors. No further complaints at this time.   Visit Diagnosis:    ICD-10-CM   1. Situational mixed anxiety and depressive disorder  F43.23       Past Psychiatric History:   No prior hospitalization  Past Psych Medication Trials: None  Past Medical History:  Past Medical History:  Diagnosis Date   Hidradenitis suppurativa    No past surgical history on file.  Family Psychiatric History:   Maternal Grandmother - bipolar  No FH of suicide, or schizophrenia   Family History:  Family History  Family history unknown: Yes   Social History:  Patient lives with fiance, Clayborne Partridge. Pt has been in a relationship x 2 years. No children at this time. 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 Stressful occupation. Reports supportive social support system. Social drinker. Nicotine vape daily. No history of legal issues or domestic violence reported. Financial and housing stability are stable.  Social History   Socioeconomic History   Marital status: Single    Spouse name: Not on file   Number of children: Not on file   Years of education: Not on file   Highest education level: Not on file  Occupational History   Not on file  Tobacco Use   Smoking status: Never   Smokeless tobacco: Never  Vaping Use   Vaping status: Every Day  Substance and Sexual Activity   Alcohol use: Yes    Comment: occasionally   Drug use: Not Currently   Sexual activity: Yes  Birth control/protection: None  Other Topics Concern   Not on file  Social History Narrative   Not on file   Social Drivers of Health   Tobacco Use: Low Risk (06/27/2023)   Patient History    Smoking Tobacco Use: Never    Smokeless Tobacco Use: Never    Passive  Exposure: Not on file  Financial Resource Strain: Low Risk (01/02/2025)   Overall Financial Resource Strain (CARDIA)    Difficulty of Paying Living Expenses: Not hard at all  Food Insecurity: No Food Insecurity (01/02/2025)   Epic    Worried About Programme Researcher, Broadcasting/film/video in the Last Year: Never true    Ran Out of Food in the Last Year: Never true  Transportation Needs: No Transportation Needs (01/02/2025)   Epic    Lack of Transportation (Medical): No    Lack of Transportation (Non-Medical): No  Physical Activity: Not on file  Stress: No Stress Concern Present (01/02/2025)   [employer] of Occupational Health - Occupational Stress Questionnaire    Feeling of Stress: Not at all  Social Connections: Not on file  Depression (PHQ2-9): Not on file  Alcohol Screen: Low Risk (01/02/2025)   Alcohol Screen    Last Alcohol Screening Score (AUDIT): 1  Housing: Low Risk (01/02/2025)   Epic    Unable to Pay for Housing in the Last Year: No    Number of Times Moved in the Last Year: 0    Homeless in the Last Year: No  Utilities: Not At Risk (01/02/2025)   Epic    Threatened with loss of utilities: No  Health Literacy: Adequate Health Literacy (01/02/2025)   B1300 Health Literacy    Frequency of need for help with medical instructions: Never    Allergies: Allergies[1]  Metabolic Disorder Labs: No results found for: HGBA1C, MPG No results found for: PROLACTIN No results found for: CHOL, TRIG, HDL, CHOLHDL, VLDL, LDLCALC No results found for: TSH  Therapeutic Level Labs: No results found for: LITHIUM No results found for: VALPROATE No results found for: CBMZ  Current Medications: Current Outpatient Medications  Medication Sig Dispense Refill   ibuprofen  (ADVIL ) 800 MG tablet Take 1 tablet (800 mg total) by mouth 3 (three) times daily. 21 tablet 0   metroNIDAZOLE  (FLAGYL ) 500 MG tablet Take 1 tablet (500 mg total) by mouth 2 (two) times daily. (Patient not taking:  Reported on 01/02/2025) 14 tablet 0   phentermine (ADIPEX-P) 37.5 MG tablet Take 1 tablet by mouth daily.     naproxen  (NAPROSYN ) 500 MG tablet Take 1 tablet (500 mg total) by mouth 2 (two) times daily with a meal. (Patient not taking: Reported on 01/02/2025) 30 tablet 0   topiramate (TOPAMAX) 25 MG tablet Take 25 mg by mouth daily. (Patient not taking: Reported on 01/02/2025)     No current facility-administered medications for this visit.    Medication Side Effects: none  Orders placed this visit:  No orders of the defined types were placed in this encounter.   Psychiatric Specialty Exam:  Review of Systems  Blood pressure (!) 134/91, pulse 81, height 6' 7 (2.007 m), weight 215 lb (97.5 kg).Body mass index is 24.22 kg/m.  General Appearance: Neat and Well Groomed  Eye Contact:  Good  Speech:  Normal Rate  Volume:  Normal  Mood:  Euthymic  Affect:  Appropriate  Thought Process:  Coherent, Goal Directed, and Linear  Orientation:  Full (Time, Place, and Person)  Thought Content: WDL   Suicidal Thoughts:  No  Homicidal Thoughts:  No  Memory:  WNL  Judgement:  Good  Insight:  Good  Psychomotor Activity:  Normal  Concentration:  Concentration: Good and Attention Span: Good  Recall:  Good  Fund of Knowledge: Good  Language: Good  Assets:  Communication Skills Desire for Improvement Financial Resources/Insurance Social Support Talents/Skills Transportation Vocational/Educational  ADL's:  Intact  Cognition: WNL  Prognosis:  Good   Screenings:  Flowsheet Row UC from 06/27/2023 in [facility] UC from 08/11/2022 in [facility] UC from 08/09/2022 in [facility] [employer] [facility])  C-SSRS RISK CATEGORY No Risk No Risk No Risk    Receiving Psychotherapy: No   Treatment Plan/Recommendations:   I provided approximately 60 minutes of face to face time during this  encounter, including time spent before and after the visit in records review, medical decision making, counseling pertinent to today's visit, and charting.   Discussed dx and tx plan. Discussed alternative options including therapy.  PDMP reviewed: low risk trend   Situational anxiety and depression - not controlled Initiate Wellbutrin  XL 150 mg po daily Will consider increasing to 300 mg po daily, if tolerated or symptoms persist Advised to keep a medication SE journal to systematically track any symptoms, noting onset, frequency, severity, and context of side effects. Monitor SE including N, HA, insomnia or sleepiness, dry mouth, sweating, dizziness, sexual dysfunction, weight changes/appetite changes, or diarrhea/constipation.  Psychotherapy was strongly recommended as important adjunct treatment to medication - provided list of local counselors.  Spent approximately 20 minutes providing education and counseling on therapeutic lifestyle modifications and sleep hygiene. Emphasizing the importance of regular exercise of at least 3x a week for 30 min, incorporating healthier diet options with a focus on balanced nutrition and reduced processed foods, and considering appropriate supplementation including multi vitamin, vitamin d, b 12, magnesium glycinate, iron to support overall well-being.  Pt will be following up with PCP to obtain baselines labs. Provided education sleep hygiene: Maintain a consistent sleep/wake schedule, limit naps, and optimize the bedroom to be dark, quiet, and cool. Advised avoiding caffeine,heavy meals, and/or nicotine/alcohol several hours before bedtime and reducing screen using other stimulating activities an hour before bed/ Recommended a relaxing free sleep routines and regular daytime physical activity while avoiding vigorous exercising at bedtime; patient to track sleep and bring concerns to follow-up  FOLLOW UP: 6 weeks or sooner if clinically indicated.  Risks,  benefits, and alternatives of the medications and treatment plan prescribed today were discussed. Instructed patient to contact office or go to ED if experiencing any significant tolerability issues. Patient engaged in shared decision-making;treatment plan reviewed and agreed upon.     Lauren Gillock, PA-C              [1] No Known Allergies

## 2025-02-13 ENCOUNTER — Ambulatory Visit: Admitting: Emergency Medicine

## 2025-09-29 ENCOUNTER — Ambulatory Visit: Payer: Self-pay | Admitting: Physician Assistant
